# Patient Record
Sex: Male | Born: 1985 | Race: White | Hispanic: No | Marital: Single | Smoking: Current every day smoker
Health system: Southern US, Community
[De-identification: ages and names within clinical notes are randomized; demographics above are authoritative.]

---

## 2017-04-03 ENCOUNTER — Emergency Department
Admission: EM | Admit: 2017-04-03 | Discharge: 2017-04-03 | Discharge status: Against Medical Advice | Payer: Self-pay | Attending: Emergency Medicine | Admitting: Emergency Medicine

## 2017-04-03 ENCOUNTER — Emergency Department: Payer: Self-pay

## 2017-04-03 ENCOUNTER — Other Ambulatory Visit: Payer: Self-pay

## 2017-04-03 DIAGNOSIS — Y939 Activity, unspecified: Secondary | ICD-10-CM | POA: Insufficient documentation

## 2017-04-03 DIAGNOSIS — X58XXXA Exposure to other specified factors, initial encounter: Secondary | ICD-10-CM | POA: Insufficient documentation

## 2017-04-03 DIAGNOSIS — Y929 Unspecified place or not applicable: Secondary | ICD-10-CM | POA: Insufficient documentation

## 2017-04-03 DIAGNOSIS — T185XXA Foreign body in anus and rectum, initial encounter: Secondary | ICD-10-CM | POA: Insufficient documentation

## 2017-04-03 DIAGNOSIS — Y999 Unspecified external cause status: Secondary | ICD-10-CM | POA: Insufficient documentation

## 2017-04-03 NOTE — Discharge Instructions (Signed)
Return to the ER right away if you change your mind and want us to help remove the foreign body, or if you have worsening symptoms.

## 2017-04-03 NOTE — ED Notes (Signed)
Patient AMA per MD, [facility]

## 2017-04-03 NOTE — ED Notes (Signed)
Patient is insisting on leaving and with MD explaining risks of leaving patient has decided to go home and try to remove the obstruction himself.  We explained if he is unsuccessful or pain increased he needs to come back here as soon as possible.

## 2017-04-03 NOTE — ED Provider Notes (Signed)
[facility] Emergency Department Provider Note  ____________________________________________  Time seen: Approximately 10:42 PM  I have reviewed the triage vital signs and the nursing notes.   HISTORY  Chief Complaint Foreign Body in Rectum    HPI Thomas Freeman is a 32 y.o. male who reports that he got a double dose stuck in his rectum at about 7 PM today. Denies any pain vomiting fever or other complaints. He's tried to remove the device but been unsuccessful. It has a flared base which he could feel getting stuck behind his tailbone.     No past medical history on file. None  There are no active problems to display for this patient.    No prior surgeries   Prior to Admission medications   Not on File  None   Allergies Patient has no known allergies.   No family history on file.  Social History Social History   Tobacco Use  . Smoking status: Not on file  Substance Use Topics  . Alcohol use: Not on file  . Drug use: Not on file    Review of Systems  Constitutional:   No fever or chills.    Gastrointestinal:   Negative for abdominal pain, vomiting and diarrhea.  Musculoskeletal:   Negative for focal pain or swelling All other systems reviewed and are negative except as documented above in ROS and HPI.  ____________________________________________   PHYSICAL EXAM:  VITAL SIGNS: ED Triage Vitals  Enc Vitals Group     BP 04/03/17 2139 129/85     Pulse Rate 04/03/17 2139 96     Resp 04/03/17 2139 18     Temp 04/03/17 2139 98.1 F (36.7 C)     Temp Source 04/03/17 2139 Oral     SpO2 04/03/17 2139 98 %     Weight 04/03/17 2137 180 lb (81.6 kg)     Height 04/03/17 2137 6\' 1"  (1.854 m)     Head Circumference --      Peak Flow --      Pain Score --      Pain Loc --      Pain Edu? --      Excl. in GC? --     Vital signs reviewed, nursing assessments reviewed.   Constitutional:   Alert and oriented. Well appearing  and in no distress. Eyes:   No scleral icterus.  EOMI. ENT   Head:   Normocephalic and atraumatic.    Neck:   No meningismus. Full ROM. Neurologic:   Normal speech and language.  Motor grossly intact. Normal gait No gross focal neurologic deficits are appreciated.    ____________________________________________    LABS (pertinent positives/negatives) (all labs ordered are listed, but only abnormal results are displayed) Labs Reviewed - No data to display ____________________________________________   EKG    ____________________________________________    RADIOLOGY  Dg Pelvis 1-2 Views  Result Date: 04/03/2017 CLINICAL DATA:  32 year old male with foreign object. EXAM: PELVIS - 1-2 VIEW; ABDOMEN - 1 VIEW COMPARISON:  None. FINDINGS: There is a large foreign object within the rectum measuring approximately 23 cm in length. The foreign object extends from the region of the rectum anteriorly with tip in the region of the anterior abdominal wall. No bowel dilatation or evidence of obstruction. No free air identified. The osseous structures and soft tissues appear unremarkable. IMPRESSION: Large foreign object extending from the region of the rectum anteriorly. No evidence of bowel obstruction. Electronically Signed   By: Burtis Junes  Radparvar M.D.   On: 04/03/2017 22:35   Dg Abdomen 1 View  Result Date: 04/03/2017 CLINICAL DATA:  32 year old male with foreign object. EXAM: PELVIS - 1-2 VIEW; ABDOMEN - 1 VIEW COMPARISON:  None. FINDINGS: There is a large foreign object within the rectum measuring approximately 23 cm in length. The foreign object extends from the region of the rectum anteriorly with tip in the region of the anterior abdominal wall. No bowel dilatation or evidence of obstruction. No free air identified. The osseous structures and soft tissues appear unremarkable. IMPRESSION: Large foreign object extending from the region of the rectum anteriorly. No evidence of bowel  obstruction. Electronically Signed   By: Elgie CollardArash  Radparvar M.D.   On: 04/03/2017 22:35    ____________________________________________   PROCEDURES Procedures  ____________________________________________    CLINICAL IMPRESSION / ASSESSMENT AND PLAN / ED COURSE  Pertinent labs & imaging results that were available during my care of the patient were reviewed by me and considered in my medical decision making (see chart for details).   Patient presents with complaint of rectal foreign body. He is able to provide a stock photo of the device showing it to be a semirigid silicone-type consistency ~with a flared circular base. X-ray was obtained to further evaluate position and shape for planning purposes. After this was obtained, the patient then reported that he did not wish to have this removed in the ED and wanted to go home and try again to dislodge it himself. Counseled him that this is unlikely to be successful given that he guarded try that and that shape and position of it. Also warned him about the dangers of pressure necrosis with this large rigid object pressing against his rectum and sigmoid colon and the concern for colonic rupture and peritonitis. He acknowledges and still wishes to go home AGAINST MEDICAL ADVICE. He has medical decision-making capacity. He was instructed to return to the ED as soon as he is willing to have this removed and certainly if he has any new or worsened symptoms. He was advised that this may even require surgical removal.      ____________________________________________   FINAL CLINICAL IMPRESSION(S) / ED DIAGNOSES    Final diagnoses:  Rectal foreign body, initial encounter       Portions of this note were generated with dragon dictation software. Dictation errors may occur despite best attempts at proofreading.    Sharman CheekStafford, Shaymus Eveleth, MD 04/03/17 2249

## 2017-04-03 NOTE — ED Triage Notes (Signed)
Patient reports having dildo stuck in rectum.

## 2017-04-04 ENCOUNTER — Emergency Department
Admission: EM | Admit: 2017-04-04 | Discharge: 2017-04-04 | Disposition: A | Discharge status: Home / Self Care | Payer: Self-pay | Attending: Emergency Medicine | Admitting: Emergency Medicine

## 2017-04-04 ENCOUNTER — Other Ambulatory Visit: Payer: Self-pay

## 2017-04-04 ENCOUNTER — Encounter: Payer: Self-pay | Admitting: Emergency Medicine

## 2017-04-04 DIAGNOSIS — X58XXXA Exposure to other specified factors, initial encounter: Secondary | ICD-10-CM | POA: Insufficient documentation

## 2017-04-04 DIAGNOSIS — Y939 Activity, unspecified: Secondary | ICD-10-CM | POA: Insufficient documentation

## 2017-04-04 DIAGNOSIS — Y999 Unspecified external cause status: Secondary | ICD-10-CM | POA: Insufficient documentation

## 2017-04-04 DIAGNOSIS — Y929 Unspecified place or not applicable: Secondary | ICD-10-CM | POA: Insufficient documentation

## 2017-04-04 DIAGNOSIS — T185XXA Foreign body in anus and rectum, initial encounter: Secondary | ICD-10-CM | POA: Insufficient documentation

## 2017-04-04 NOTE — ED Notes (Signed)
FN: pt was seen here last night due to having a dildo stuck in his rectum. Was told to return back today if could not get the rest of it out. Pt appears to be in NAD at time of check in.

## 2017-04-04 NOTE — ED Triage Notes (Signed)
Pt arrived via POV, states he was unsuccessful at removing sex toy last night. Pt was seen here but left AMA.  Pt denies any pain or any new symptoms.

## 2017-04-04 NOTE — ED Provider Notes (Signed)
[facility] Emergency Department Provider Note  ___________________________________________   First MD Initiated Contact with Patient 04/04/17 1100     (approximate)  I have reviewed the triage vital signs and the nursing notes.   HISTORY  Chief Complaint Foreign Body in Rectum   HPI Thomas Freeman is a 32 y.o. male without chronic medical conditions who is presenting to the emergency department today with a rectal foreign body.  He says that he was experimenting sexually with his girlfriend last night when he had a "dildo" that became lodged in his rectum.  He presented to the emergency department last night but left before having the object removed.  He was persistently unable to have a foreign object removed at home and so presented back to the emergency department today.  However, prior to my entering the room, he was able to pull the foreign body from his rectum.  He is denying any abdominal pain, nausea or vomiting.  Also denying any rectal bleeding or pain at this time.  He says that his symptoms are relieved.  He described the removal of the object by putting fingers into his rectum and then pushing the wall of the rectum away from the object and removing it.   History reviewed. No pertinent past medical history.  There are no active problems to display for this patient.   History reviewed. No pertinent surgical history.  Prior to Admission medications   Not on File    Allergies Patient has no known allergies.  History reviewed. No pertinent family history.  Social History Social History   Tobacco Use  . Smoking status: Current Every Day Smoker    Packs/day: 0.50    Types: Cigarettes  . Smokeless tobacco: Never Used  Substance Use Topics  . Alcohol use: Not on file  . Drug use: Not on file    Review of Systems  Constitutional: No fever/chills Eyes: No visual changes. ENT: No sore throat. Cardiovascular: Denies chest  pain. Respiratory: Denies shortness of breath. Gastrointestinal: No abdominal pain.  No nausea, no vomiting.  No diarrhea.  No constipation. Genitourinary: Negative for dysuria. Musculoskeletal: Negative for back pain. Skin: Negative for rash. Neurological: Negative for headaches, focal weakness or numbness.   ____________________________________________   PHYSICAL EXAM:  VITAL SIGNS: ED Triage Vitals  Enc Vitals Group     BP 04/04/17 1047 131/83     Pulse Rate 04/04/17 1047 100     Resp 04/04/17 1047 18     Temp 04/04/17 1047 98.3 F (36.8 C)     Temp Source 04/04/17 1047 Oral     SpO2 04/04/17 1047 100 %     Weight 04/04/17 1047 180 lb (81.6 kg)     Height 04/04/17 1047 6\' 1"  (1.854 m)     Head Circumference --      Peak Flow --      Pain Score 04/04/17 1049 0     Pain Loc --      Pain Edu? --      Excl. in GC? --     Constitutional: Alert and oriented. Well appearing and in no acute distress.  Patient showed me the sex toy that he had placed in the trash can after removing it from his rectum.  It is penis shaped and appears to be about 8-9 inches in length as well as 2-3inches in girth.  There was no blood on the object.  No stool noted either.  Eyes: Conjunctivae are normal.  Head: Atraumatic. Nose: No congestion/rhinnorhea. Mouth/Throat: Mucous membranes are moist.  Neck: No stridor.   Cardiovascular: Normal rate, regular rhythm. Grossly normal heart sounds.   Respiratory: Normal respiratory effort.  No retractions. Lungs CTAB. Gastrointestinal: Soft and nontender. No distention.  Musculoskeletal: No lower extremity tenderness nor edema.  No joint effusions. Neurologic:  Normal speech and language. No gross focal neurologic deficits are appreciated. Skin:  Skin is warm, dry and intact. No rash noted. Psychiatric: Mood and affect are normal. Speech and behavior are normal.  ____________________________________________   LABS (all labs ordered are listed, but  only abnormal results are displayed)  Labs Reviewed - No data to display ____________________________________________  EKG   ____________________________________________  RADIOLOGY   ____________________________________________   PROCEDURES  Procedure(s) performed:   Procedures  Critical Care performed:   ____________________________________________   INITIAL IMPRESSION / ASSESSMENT AND PLAN / ED COURSE  Pertinent labs & imaging results that were available during my care of the patient were reviewed by me and considered in my medical decision making (see chart for details).  DDX: Rectal foreign body, bowel perforation, bowel obstruction, rectal pain, abdominal pain As part of my medical decision making, I reviewed the following data within the electronic MEDICAL RECORD NUMBERReviewed ER visit from yesterday as well as the imaging from yesterday  Patient with object removed and symptoms resolved.  Will discharge at this time.  No findings of bowel perforation on exam.  Patient is pain-free and with a benign abdomen.  Patient with nontoxic appearance.      ____________________________________________   FINAL CLINICAL IMPRESSION(S) / ED DIAGNOSES  Final diagnoses:  Rectal foreign body, initial encounter      NEW MEDICATIONS STARTED DURING THIS VISIT:  New Prescriptions   No medications on file     Note:  This document was prepared using Dragon voice recognition software and may include unintentional dictation errors.     Myrna BlazerSchaevitz, David Matthew, MD 04/04/17 1122

## 2019-02-20 IMAGING — DX DG PELVIS 1-2V
2 series · 2 of 2 positions shown · non-contrast
Comparison: None.

CLINICAL DATA: 31-year-old male with foreign object.

EXAM:
PELVIS - 1-2 VIEW; ABDOMEN - 1 VIEW

[pelvis ap]
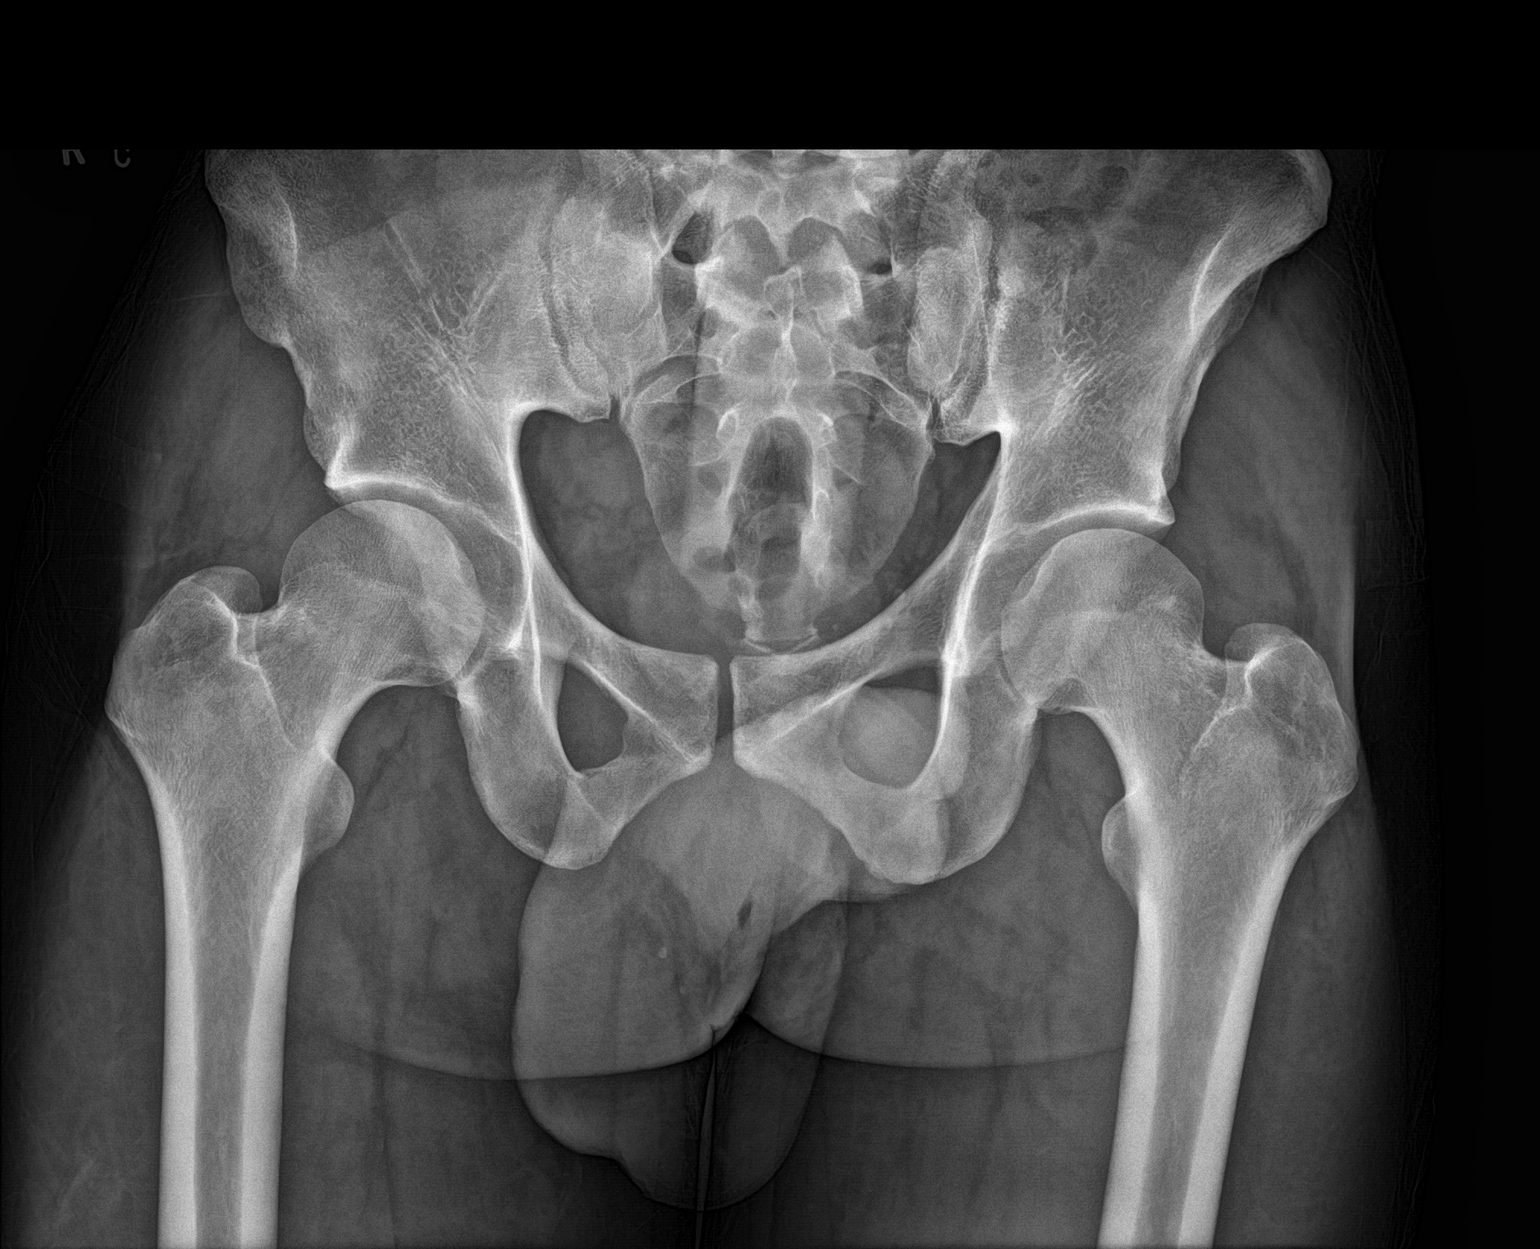

[pelvis lat]
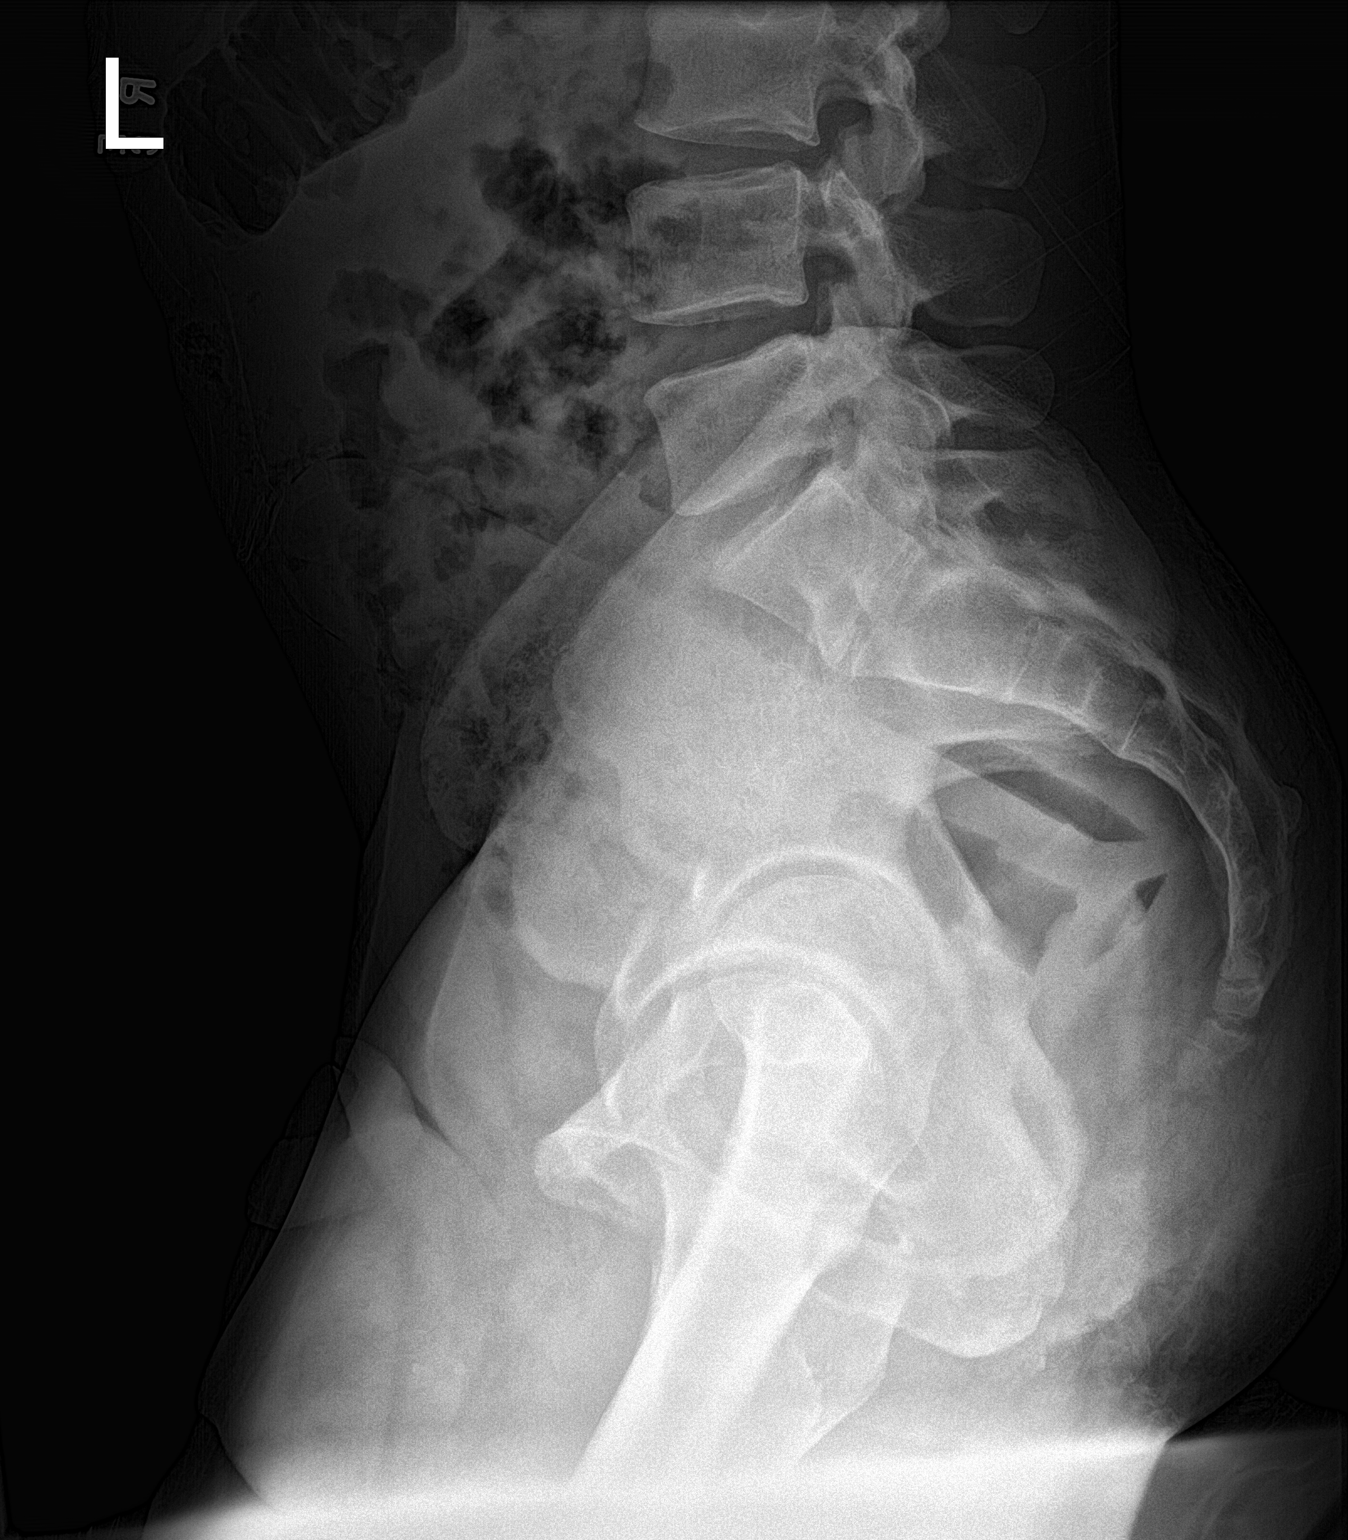

[2 of 2 positions shown; findings below may reference images not displayed]

FINDINGS: There is a large foreign object within the rectum measuring
approximately 23 cm in length. The foreign object extends from the
region of the rectum anteriorly with tip in the region of the
anterior abdominal wall. No bowel dilatation or evidence of
obstruction. No free air identified. The osseous structures and soft
tissues appear unremarkable.
IMPRESSION: Large foreign object extending from the region of the rectum
anteriorly. No evidence of bowel obstruction.

## 2019-02-20 IMAGING — DX DG ABDOMEN 1V
2 series · 2 of 2 positions shown · non-contrast
Comparison: None.

CLINICAL DATA: 31-year-old male with foreign object.

EXAM:
PELVIS - 1-2 VIEW; ABDOMEN - 1 VIEW

[abdomen kub]
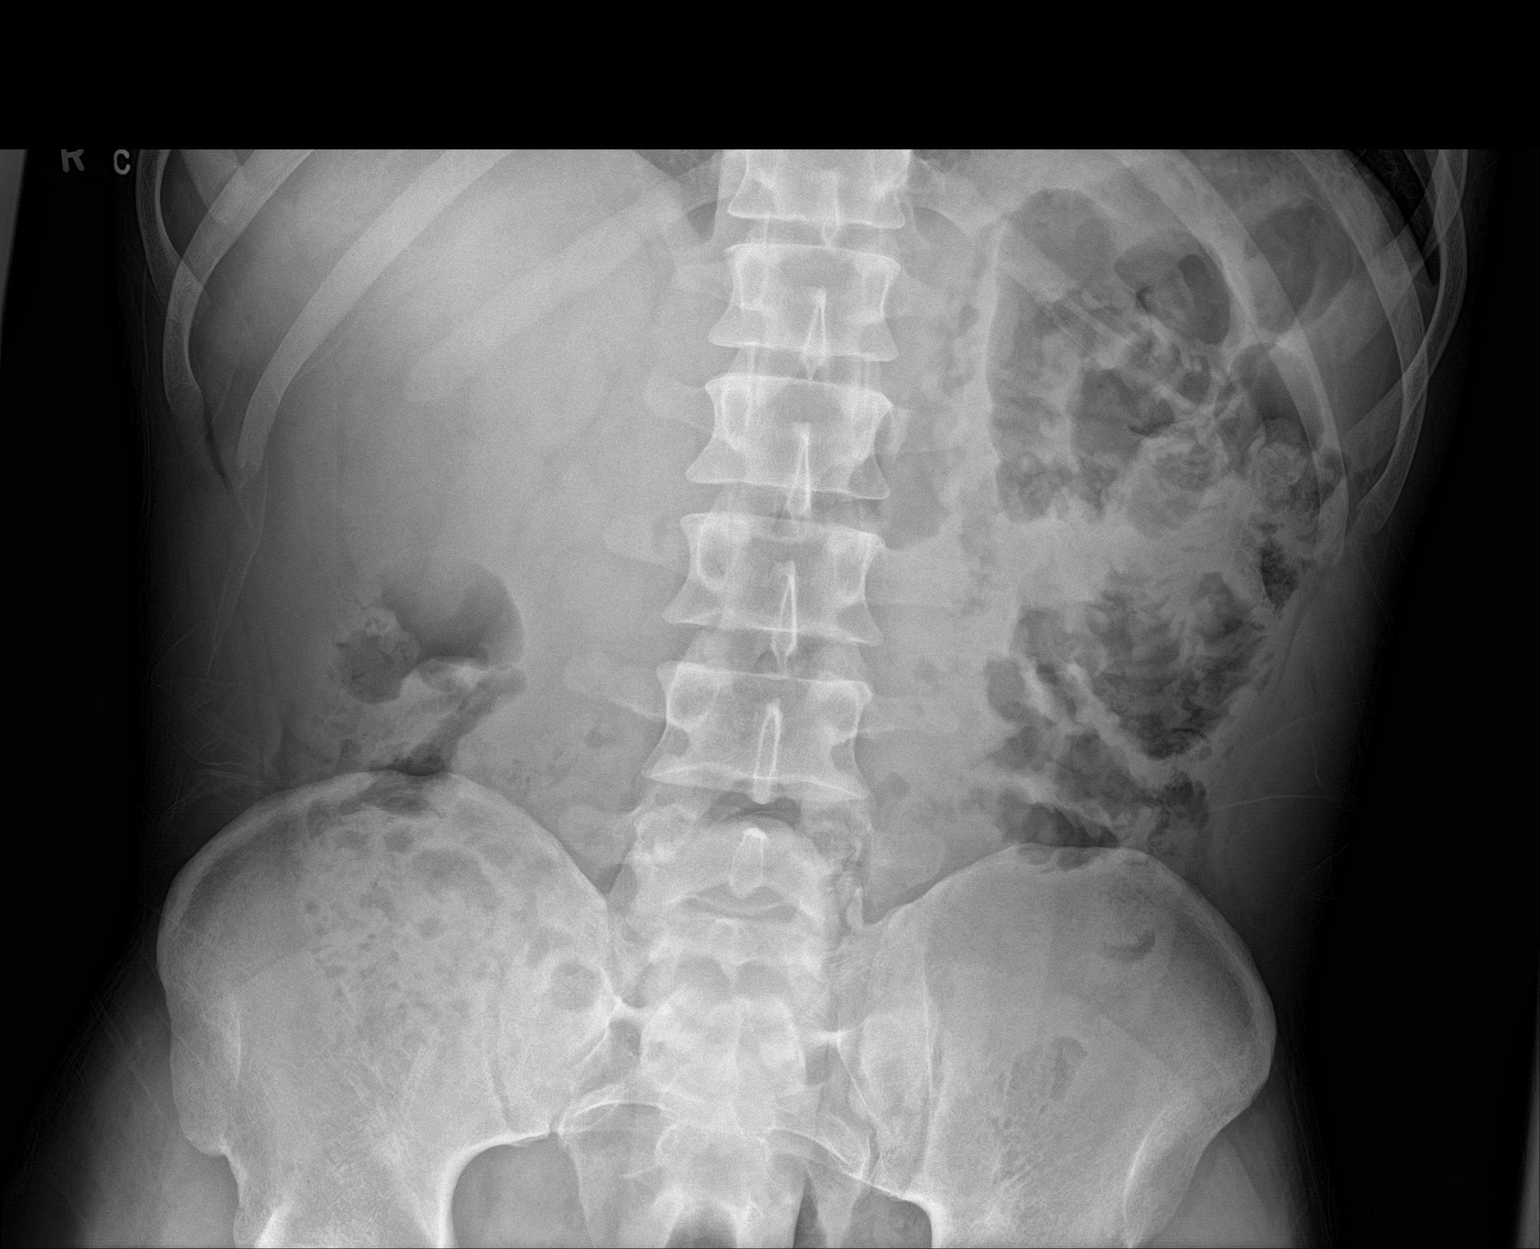

[pelvis ap]
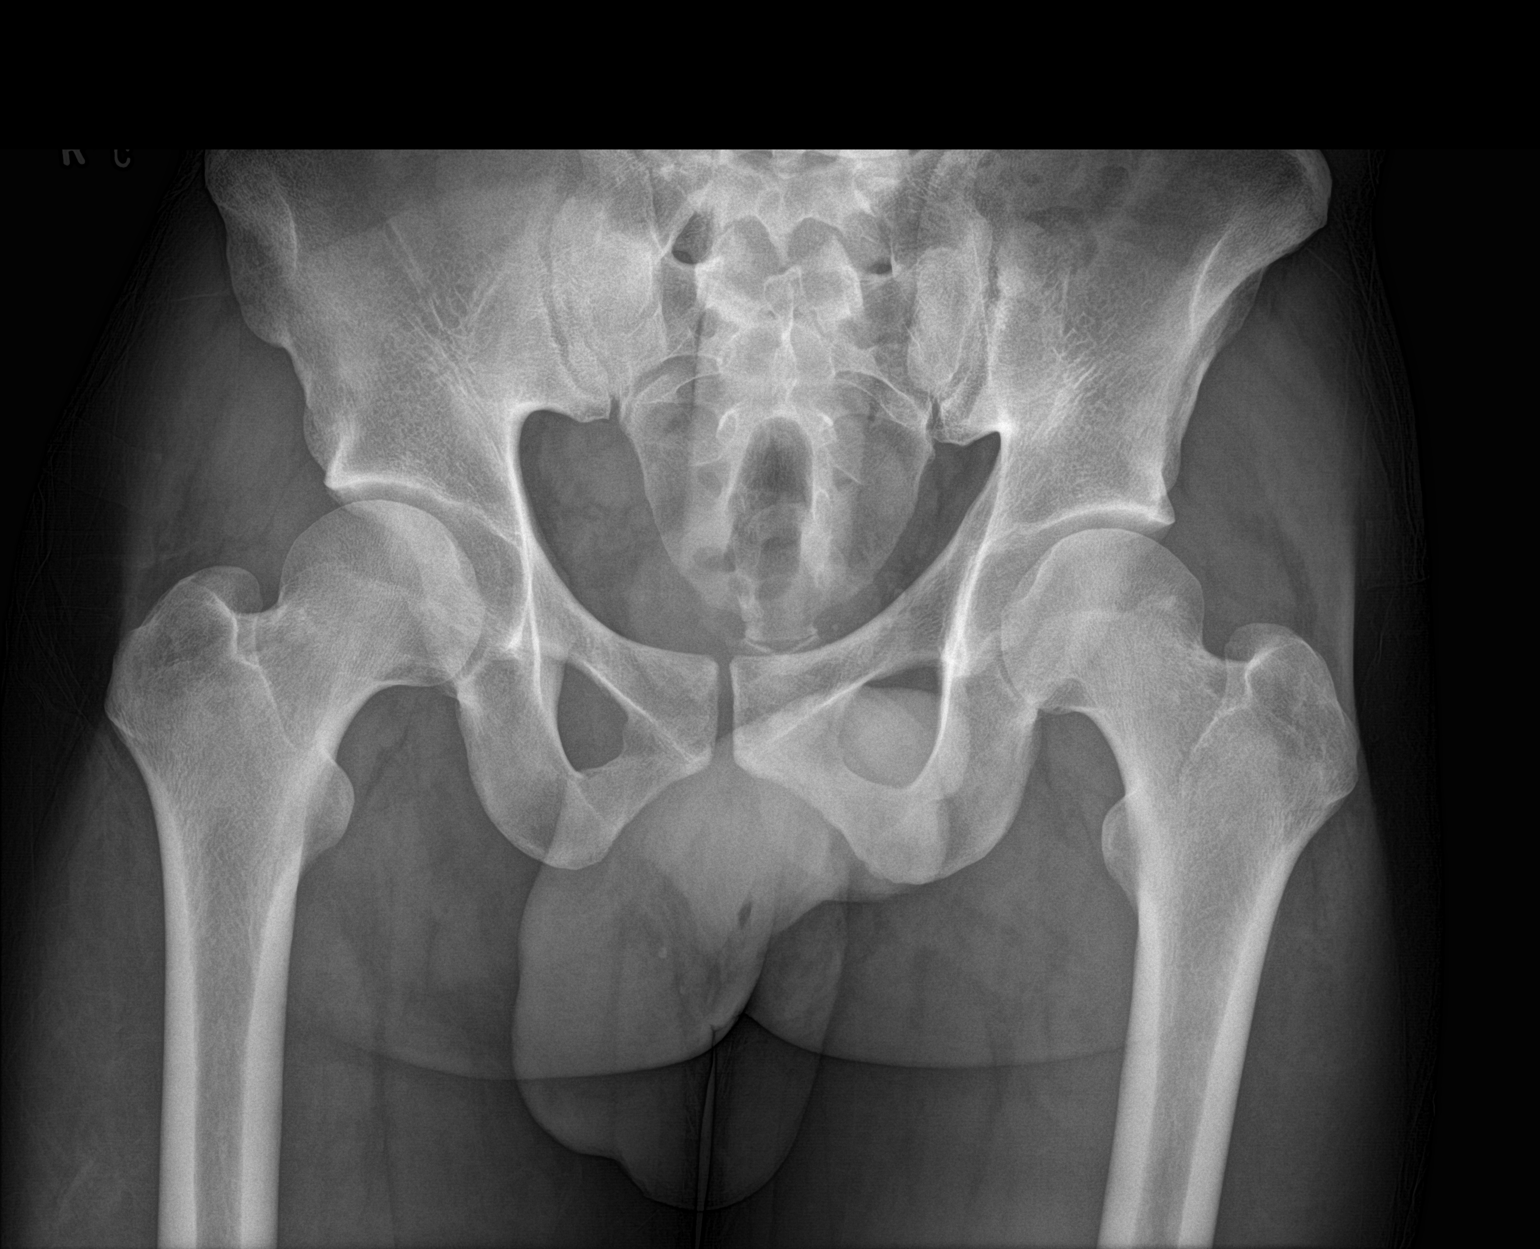

[2 of 2 positions shown; findings below may reference images not displayed]

FINDINGS: There is a large foreign object within the rectum measuring
approximately 23 cm in length. The foreign object extends from the
region of the rectum anteriorly with tip in the region of the
anterior abdominal wall. No bowel dilatation or evidence of
obstruction. No free air identified. The osseous structures and soft
tissues appear unremarkable.
IMPRESSION: Large foreign object extending from the region of the rectum
anteriorly. No evidence of bowel obstruction.
